# Patient Record
Sex: Male | Born: 1995 | Race: White | Hispanic: No | Marital: Single | Smoking: Never smoker
Health system: Southern US, Community
[De-identification: ages and names within clinical notes are randomized; demographics above are authoritative.]

## PROBLEM LIST (undated history)

## (undated) DIAGNOSIS — G43909 Migraine, unspecified, not intractable, without status migrainosus: Secondary | ICD-10-CM

## (undated) HISTORY — DX: Migraine, unspecified, not intractable, without status migrainosus: G43.909

---
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
## 2015-05-12 ENCOUNTER — Encounter: Payer: Self-pay | Admitting: Family Medicine

## 2015-05-12 ENCOUNTER — Ambulatory Visit (INDEPENDENT_AMBULATORY_CARE_PROVIDER_SITE_OTHER): Payer: PRIVATE HEALTH INSURANCE | Admitting: Family Medicine

## 2015-05-12 VITALS — BP 138/82 | HR 94 | Temp 98.3°F | Ht 71.0 in | Wt 164.0 lb

## 2015-05-12 DIAGNOSIS — J209 Acute bronchitis, unspecified: Secondary | ICD-10-CM | POA: Diagnosis not present

## 2015-05-12 MED ORDER — AZITHROMYCIN 250 MG PO TABS: ORAL_TABLET | ORAL | Status: DC

## 2015-05-12 MED ORDER — HYDROCODONE-HOMATROPINE 5-1.5 MG/5ML PO SYRP: 5.0000 mL | ORAL_SOLUTION | ORAL | Status: DC | PRN

## 2015-05-12 NOTE — Progress Notes (Signed)
Pre visit review using our clinic review tool, if applicable. No additional management support is needed unless otherwise documented below in the visit note. 

## 2015-05-12 NOTE — Progress Notes (Signed)
   Subjective:    Patient ID: Lance Vasquez, male    DOB: 04/10/1996, 19 y.o.   MRN: [id number]  HPI Here for 8 days of chest tightness and coughing up green sputum. He started off with body aches, HA, and ST also but these went away.    Review of Systems  Constitutional: Positive for fever.  HENT: Positive for congestion and postnasal drip. Negative for sinus pressure and sore throat.   Eyes: Negative.   Respiratory: Positive for cough and chest tightness.        Objective:   Physical Exam  Constitutional: He appears well-developed and well-nourished.  HENT:  Right Ear: External ear normal.  Left Ear: External ear normal.  Nose: Nose normal.  Mouth/Throat: Oropharynx is clear and moist.  Eyes: Conjunctivae are normal.  Pulmonary/Chest: Effort normal. No respiratory distress. He has no wheezes. He has no rales.  Scattered rhonchi   Lymphadenopathy:    He has no cervical adenopathy.          Assessment & Plan:  Bronchitis, treat with a Zpack.

## 2015-07-12 ENCOUNTER — Ambulatory Visit (INDEPENDENT_AMBULATORY_CARE_PROVIDER_SITE_OTHER): Payer: PRIVATE HEALTH INSURANCE | Admitting: Family Medicine

## 2015-07-12 ENCOUNTER — Encounter: Payer: Self-pay | Admitting: Family Medicine

## 2015-07-12 VITALS — BP 135/85 | HR 89 | Temp 98.8°F | Ht 71.0 in | Wt 174.0 lb

## 2015-07-12 DIAGNOSIS — N509 Disorder of male genital organs, unspecified: Secondary | ICD-10-CM | POA: Diagnosis not present

## 2015-07-12 DIAGNOSIS — N5089 Other specified disorders of the male genital organs: Secondary | ICD-10-CM

## 2015-07-12 DIAGNOSIS — J309 Allergic rhinitis, unspecified: Secondary | ICD-10-CM | POA: Diagnosis not present

## 2015-07-12 NOTE — Progress Notes (Signed)
   Subjective:    Patient ID: Lance Vasquez, male    DOB: 03/25/1996, 20 y.o.   MRN: [id number]  HPI Here for 2 things. First he has had intermittent nasal congestion in both sides of the nose for the past month. No other symptoms. Also yesterday he felt a small lump in the scrotum that worried him, but today he cannot feel it. There was no discomfort, no urethral DC, and no urinary symptoms. He admits to having more frequent intercourse with his girlfriend this past weekend than usual.    Review of Systems  Constitutional: Negative.   HENT: Positive for congestion. Negative for facial swelling, nosebleeds, postnasal drip, rhinorrhea, sinus pressure and sore throat.   Eyes: Negative.   Respiratory: Negative.   Genitourinary: Negative for dysuria, urgency, frequency, hematuria, discharge, penile swelling, scrotal swelling, genital sores, penile pain and testicular pain.       Objective:   Physical Exam  Constitutional: He appears well-developed and well-nourished.  HENT:  Right Ear: External ear normal.  Left Ear: External ear normal.  Nose: Nose normal.  Mouth/Throat: Oropharynx is clear and moist. No oropharyngeal exudate.  Eyes: Conjunctivae are normal.  Genitourinary:  Normal scrotal exam, no masses or tenderness  Lymphadenopathy:    He has no cervical adenopathy.          Assessment & Plan:  Nasal congestion, try Flonase daily. His scrotal exam is normal. I suspect he may have felt one of his vas deferens structures over the weekend. He was reassured, and he wlil follow up prn

## 2015-07-12 NOTE — Progress Notes (Signed)
Pre visit review using our clinic review tool, if applicable. No additional management support is needed unless otherwise documented below in the visit note. 

## 2022-01-24 ENCOUNTER — Encounter: Payer: Self-pay | Admitting: Family Medicine

## 2022-01-24 ENCOUNTER — Ambulatory Visit (INDEPENDENT_AMBULATORY_CARE_PROVIDER_SITE_OTHER): Payer: BC Managed Care – PPO | Admitting: Family Medicine

## 2022-01-24 VITALS — BP 128/78 | HR 104 | Temp 98.9°F | Wt 197.0 lb

## 2022-01-24 DIAGNOSIS — J019 Acute sinusitis, unspecified: Secondary | ICD-10-CM

## 2022-01-24 DIAGNOSIS — R059 Cough, unspecified: Secondary | ICD-10-CM

## 2022-01-24 LAB — POC COVID19 BINAXNOW: SARS Coronavirus 2 Ag: NEGATIVE

## 2022-01-24 LAB — POCT INFLUENZA A/B
Influenza A, POC: NEGATIVE
Influenza B, POC: NEGATIVE

## 2022-01-24 MED ORDER — HYDROCODONE BIT-HOMATROP MBR 5-1.5 MG/5ML PO SOLN: 5.0000 mL | ORAL | 0 refills | Status: DC | PRN

## 2022-01-24 MED ORDER — AZITHROMYCIN 250 MG PO TABS: ORAL_TABLET | ORAL | 0 refills | Status: DC

## 2022-01-24 NOTE — Progress Notes (Signed)
   Subjective:    Patient ID: Lance Vasquez, male    DOB: 26-Dec-1995, 26 y.o.   MRN: [id number]  HPI Here for 5 days of sinus congestion, PND, ST, and a dry cough. No fever or SOB. Using Mucinex.    Review of Systems  Constitutional: Negative.   HENT:  Positive for congestion, postnasal drip, sinus pressure and sore throat. Negative for ear pain.   Eyes: Negative.   Respiratory:  Positive for cough. Negative for shortness of breath and wheezing.        Objective:   Physical Exam Constitutional:      Appearance: Normal appearance. He is not ill-appearing.  HENT:     Right Ear: Tympanic membrane, ear canal and external ear normal.     Left Ear: Tympanic membrane, ear canal and external ear normal.     Nose: Nose normal.     Mouth/Throat:     Pharynx: Oropharynx is clear.  Eyes:     Conjunctiva/sclera: Conjunctivae normal.  Pulmonary:     Effort: Pulmonary effort is normal.     Breath sounds: Normal breath sounds.  Lymphadenopathy:     Cervical: No cervical adenopathy.  Neurological:     Mental Status: He is alert.           Assessment & Plan:  Sinusitis, treat with a Zpack.  Gershon Crane, MD

## 2022-07-31 ENCOUNTER — Ambulatory Visit: Payer: BC Managed Care – PPO | Admitting: Family Medicine

## 2022-07-31 ENCOUNTER — Encounter: Payer: Self-pay | Admitting: Family Medicine

## 2022-07-31 VITALS — BP 120/82 | HR 84 | Temp 98.6°F | Wt 205.0 lb

## 2022-07-31 DIAGNOSIS — N451 Epididymitis: Secondary | ICD-10-CM | POA: Diagnosis not present

## 2022-07-31 MED ORDER — DOXYCYCLINE HYCLATE 100 MG PO CAPS: 100.0000 mg | ORAL_CAPSULE | Freq: Two times a day (BID) | ORAL | 0 refills | Status: AC

## 2022-07-31 NOTE — Progress Notes (Signed)
   Subjective:    Patient ID: Lance Vasquez, male    DOB: June 05, 1996, 27 y.o.   MRN: [id number]  HPI Here for 2 weeks of intermittent pain in the left testicle. It is usually worst at the top or the back of the testicle. No hx of trauma. No swelling. He has had 3 episodes of this in the past 3 years. He has always been given a course of Cipro, and this seems to take care of it. No urinary burning or urgency. No urethral DC.    Review of Systems  Constitutional: Negative.   Respiratory: Negative.    Cardiovascular: Negative.   Genitourinary:  Positive for testicular pain. Negative for dysuria, genital sores, penile discharge, penile pain, scrotal swelling and urgency.       Objective:   Physical Exam Constitutional:      Appearance: Normal appearance.  Cardiovascular:     Rate and Rhythm: Normal rate and regular rhythm.     Pulses: Normal pulses.     Heart sounds: Normal heart sounds.  Pulmonary:     Effort: Pulmonary effort is normal.     Breath sounds: Normal breath sounds.  Genitourinary:    Comments: No inguinal hernias. The scrotum and testicles are normal on exam. No tenderness  Neurological:     Mental Status: He is alert.           Assessment & Plan:  Epididymitis. This time we will treat him with 10 days of Doxycycline. Recheck as needed.  Alysia Penna, MD

## 2022-08-28 ENCOUNTER — Encounter: Payer: Self-pay | Admitting: Family Medicine

## 2022-08-28 ENCOUNTER — Ambulatory Visit: Payer: BC Managed Care – PPO | Admitting: Family Medicine

## 2022-08-28 VITALS — BP 128/82 | HR 91 | Temp 98.6°F | Ht 71.0 in | Wt 210.0 lb

## 2022-08-28 DIAGNOSIS — R3915 Urgency of urination: Secondary | ICD-10-CM | POA: Diagnosis not present

## 2022-08-28 DIAGNOSIS — R350 Frequency of micturition: Secondary | ICD-10-CM | POA: Diagnosis not present

## 2022-08-28 LAB — POC URINALSYSI DIPSTICK (AUTOMATED)
Bilirubin, UA: NEGATIVE
Blood, UA: NEGATIVE
Glucose, UA: NEGATIVE
Ketones, UA: NEGATIVE
Leukocytes, UA: NEGATIVE
Nitrite, UA: NEGATIVE
Protein, UA: NEGATIVE
Spec Grav, UA: 1.02 (ref 1.010–1.025)
Urobilinogen, UA: 0.2 E.U./dL
pH, UA: 6 (ref 5.0–8.0)

## 2022-08-28 MED ORDER — DOXYCYCLINE HYCLATE 100 MG PO CAPS: 100.0000 mg | ORAL_CAPSULE | Freq: Two times a day (BID) | ORAL | 0 refills | Status: DC

## 2022-08-28 NOTE — Progress Notes (Signed)
   Subjective:    Patient ID: Gwenyth Bouillon, male    DOB: Aug 18, 1995, 27 y.o.   MRN: [id number]  HPI Here for one week of urinary urgency and for post-urination dribbling. No back pain or fever. No testicular pain. We saw him last month for pain in the left testicle, and we prescribed a 10 day course of Doxycycline. He says the pain went away and he never picked up the Doxycycline. Now he has symptoms again.    Review of Systems  Constitutional: Negative.   Respiratory: Negative.    Cardiovascular: Negative.   Gastrointestinal: Negative.   Genitourinary:  Positive for frequency and urgency. Negative for difficulty urinating, dysuria, flank pain, hematuria, penile discharge and testicular pain.       Objective:   Physical Exam Constitutional:      Appearance: Normal appearance.  Cardiovascular:     Rate and Rhythm: Normal rate and regular rhythm.     Pulses: Normal pulses.     Heart sounds: Normal heart sounds.  Pulmonary:     Effort: Pulmonary effort is normal.     Breath sounds: Normal breath sounds.  Neurological:     Mental Status: He is alert.           Assessment & Plan:  Urinary urgency, likely due to a prostate infection. I advised him to have the pharmacy prepare the Doxycycline again and to take the full 10 day course. We will send the sample for a culture and for GC/Chlamydia testing.  Alysia Penna, MD

## 2022-08-29 LAB — URINE CULTURE
MICRO NUMBER:: 14613758
Result:: NO GROWTH
SPECIMEN QUALITY:: ADEQUATE

## 2022-08-29 LAB — C. TRACHOMATIS/N. GONORRHOEAE RNA
C. trachomatis RNA, TMA: NOT DETECTED
N. gonorrhoeae RNA, TMA: NOT DETECTED

## 2022-12-06 ENCOUNTER — Ambulatory Visit: Payer: BC Managed Care – PPO | Admitting: Family Medicine

## 2022-12-06 ENCOUNTER — Encounter: Payer: Self-pay | Admitting: Family Medicine

## 2022-12-06 VITALS — BP 118/78 | HR 102 | Temp 98.2°F | Wt 201.0 lb

## 2022-12-06 DIAGNOSIS — M25512 Pain in left shoulder: Secondary | ICD-10-CM

## 2022-12-06 DIAGNOSIS — G8929 Other chronic pain: Secondary | ICD-10-CM

## 2022-12-06 DIAGNOSIS — R29898 Other symptoms and signs involving the musculoskeletal system: Secondary | ICD-10-CM

## 2022-12-06 NOTE — Progress Notes (Signed)
   Subjective:    Patient ID: Lance Vasquez, male    DOB: 10-29-1995, 27 y.o.   MRN: [id number]  HPI Here for intermittent pain in the left shoulder that started 3 years ago. There is no specific hx of trauma, but he lifts weights frequently, and the pain flares up after he has lifted a few times. The pain then goes away with rest. He has never treated this with anything. The pain is sharp and in the anterior shoulder. He also asks about some mild weakness in the right arm that he first noticed 3 days ago. There is no particular pain. No neck pains. Today this feels fine.    Review of Systems  Constitutional: Negative.   Respiratory: Negative.    Cardiovascular: Negative.   Musculoskeletal:  Positive for arthralgias.  Neurological:  Positive for weakness. Negative for numbness.       Objective:   Physical Exam Constitutional:      General: He is not in acute distress.    Appearance: Normal appearance.  Cardiovascular:     Rate and Rhythm: Normal rate and regular rhythm.     Pulses: Normal pulses.     Heart sounds: Normal heart sounds.  Pulmonary:     Effort: Pulmonary effort is normal.     Breath sounds: Normal breath sounds.  Musculoskeletal:     Comments: Both shoulders are normal with no tenderness and no crepitus. ROM is full on both sides. Strength is intact on both sides.   Neurological:     Mental Status: He is alert.           Assessment & Plan:  He has a chronic left shoulder pain, and we will refer him to Sports Medicine for this. The etiology of the right arm weakness is not clear, so we will continue to monitor this.  Gershon Crane, MD

## 2022-12-08 NOTE — Progress Notes (Unsigned)
    Lance Vasquez D.[facility] [address] [place] [address] Phone: [phone]   Assessment and Plan:     There are no diagnoses linked to this encounter.  ***   Pertinent previous records reviewed include ***   Follow Up: ***     Subjective:   I, Lance Vasquez, am serving as a Neurosurgeon for Lance Vasquez  Chief Complaint: left shoulder pain   HPI:   12/11/2022  Patient is a 27 year old male complaining of left shoulder pain. Patient states  Relevant Historical Information: ***  Additional pertinent review of systems negative.   Current Outpatient Medications:    acetaminophen (TYLENOL) 325 MG tablet, Take 2 tablets (650 mg total) by mouth every 6 (six) hours as needed for pain., Disp: , Rfl:    cetirizine (ZYRTEC) 10 MG tablet, Take 10 mg by mouth daily as needed for allergies., Disp: , Rfl:    ibuprofen (ADVIL) 200 MG tablet, Take 2-3 tablets every 6 hours., Disp: 30 tablet, Rfl: 0   Objective:     There were no vitals filed for this visit.    There is no height or weight on file to calculate BMI.    Physical Exam:    ***   Electronically signed by:  Lance Vasquez D.[facility] 2:55 PM 12/08/22

## 2022-12-11 ENCOUNTER — Ambulatory Visit: Payer: BC Managed Care – PPO | Admitting: Sports Medicine

## 2022-12-11 ENCOUNTER — Ambulatory Visit (INDEPENDENT_AMBULATORY_CARE_PROVIDER_SITE_OTHER): Payer: BC Managed Care – PPO

## 2022-12-11 VITALS — BP 118/68 | HR 101 | Ht 71.0 in | Wt 201.0 lb

## 2022-12-11 DIAGNOSIS — M25512 Pain in left shoulder: Secondary | ICD-10-CM

## 2022-12-11 DIAGNOSIS — G8929 Other chronic pain: Secondary | ICD-10-CM

## 2022-12-11 MED ORDER — MELOXICAM 15 MG PO TABS: 15.0000 mg | ORAL_TABLET | Freq: Every day | ORAL | 0 refills | Status: DC

## 2022-12-11 NOTE — Patient Instructions (Signed)
-   Start meloxicam 15 mg daily x2 weeks.  If still having pain after 2 weeks, complete 3rd-week of meloxicam. May use remaining meloxicam as needed once daily for pain control.  Do not to use additional NSAIDs while taking meloxicam.  May use Tylenol 500-1000 mg 2 to 3 times a day for breakthrough pain. Shoulder HEP  4 week follow up  

## 2023-01-08 ENCOUNTER — Ambulatory Visit: Payer: BC Managed Care – PPO | Admitting: Sports Medicine

## 2023-01-19 NOTE — Progress Notes (Deleted)
    [name] D.[name] [address] [place] [address] Phone: [phone]   Assessment and Plan:     There are no diagnoses linked to this encounter.  ***   Pertinent previous records reviewed include ***   Follow Up: ***     Subjective:   I,  , am serving as a Neurosurgeon for Doctor Richardean Sale   Chief Complaint: left shoulder pain    HPI:    12/11/2022  Patient is a 27 year old male complaining of left shoulder pain. Patient states he has had pain since the beginning of the year , he feels it when he does any kind of push exercises/ or chest exercises. He gets intermittent snagging and popping. Pain radiates into the neck when he does have it. No meds, no numbness or tingling, he notes weakness or he doesn't have his full strength, states he had a weird hand tremor last week when he was eating dinner that subsided after 10 min   He states he feels some carpal tunnel going on in his right arm that started last week   01/22/2023 Patient states    Relevant Historical Information: None pertinent  Additional pertinent review of systems negative.   Current Outpatient Medications:    acetaminophen (TYLENOL) 325 MG tablet, Take 2 tablets (650 mg total) by mouth every 6 (six) hours as needed for pain., Disp: , Rfl:    cetirizine (ZYRTEC) 10 MG tablet, Take 10 mg by mouth daily as needed for allergies., Disp: , Rfl:    ibuprofen (ADVIL) 200 MG tablet, Take 2-3 tablets every 6 hours., Disp: 30 tablet, Rfl: 0   meloxicam (MOBIC) 15 MG tablet, Take 1 tablet (15 mg total) by mouth daily., Disp: 30 tablet, Rfl: 0   Objective:     There were no vitals filed for this visit.    There is no height or weight on file to calculate BMI.    Physical Exam:    ***   Electronically signed by:  [name] D.[name] 7:24 AM 01/19/23

## 2023-01-22 ENCOUNTER — Ambulatory Visit: Payer: BC Managed Care – PPO | Admitting: Sports Medicine

## 2023-02-20 ENCOUNTER — Encounter: Payer: Self-pay | Admitting: Family Medicine

## 2023-02-20 ENCOUNTER — Ambulatory Visit: Payer: BC Managed Care – PPO | Admitting: Family Medicine

## 2023-02-20 VITALS — BP 118/80 | HR 96 | Temp 98.7°F | Wt 202.0 lb

## 2023-02-20 DIAGNOSIS — K529 Noninfective gastroenteritis and colitis, unspecified: Secondary | ICD-10-CM | POA: Diagnosis not present

## 2023-02-20 MED ORDER — CIPROFLOXACIN HCL 500 MG PO TABS: 500.0000 mg | ORAL_TABLET | Freq: Two times a day (BID) | ORAL | 0 refills | Status: AC

## 2023-02-20 NOTE — Progress Notes (Signed)
   Subjective:    Patient ID: Lance Vasquez, male    DOB: 29-Mar-1996, 27 y.o.   MRN: [id number]  HPI Here for 3 weeks of diffuse abdominal' cramps, nausea without vomiting, and diarrhea. No fever. No recent travelling. Using Pepto-Bismol.    Review of Systems  Constitutional: Negative.   Respiratory: Negative.    Cardiovascular: Negative.   Gastrointestinal:  Positive for abdominal pain, diarrhea and nausea. Negative for abdominal distention, anal bleeding, blood in stool, constipation and vomiting.  Genitourinary: Negative.        Objective:   Physical Exam Constitutional:      Appearance: Normal appearance.  Cardiovascular:     Rate and Rhythm: Normal rate and regular rhythm.     Pulses: Normal pulses.     Heart sounds: Normal heart sounds.  Pulmonary:     Effort: Pulmonary effort is normal.     Breath sounds: Normal breath sounds.  Abdominal:     General: Abdomen is flat. Bowel sounds are normal. There is no distension.     Palpations: Abdomen is soft. There is no mass.     Tenderness: There is no abdominal tenderness. There is no right CVA tenderness, left CVA tenderness, guarding or rebound.     Hernia: No hernia is present.  Neurological:     Mental Status: He is alert.           Assessment & Plan:  Enteritis. Treat with 10 days of Cipro.  Gershon Crane, MD

## 2023-06-29 ENCOUNTER — Ambulatory Visit: Payer: BC Managed Care – PPO | Admitting: Family Medicine

## 2023-06-29 ENCOUNTER — Encounter: Payer: Self-pay | Admitting: Family Medicine

## 2023-06-29 VITALS — BP 110/72 | HR 78 | Temp 98.7°F | Wt 205.0 lb

## 2023-06-29 DIAGNOSIS — R197 Diarrhea, unspecified: Secondary | ICD-10-CM

## 2023-06-29 NOTE — Progress Notes (Signed)
   Subjective:    Patient ID: Lance Vasquez, male    DOB: 01-29-1996, 27 y.o.   MRN: [id number]  HPI Here asking for a referral to GI for various symptoms that come and go. These started about 6 months ago, and they seem to be more frequent now. He describes spells of generalized abdominal cramps with diarrhea. He cannot find a pattern between these episodes and his diet. Sometimes he gets heartburn, and he treats this with Pepcid as needed. Today he feels fine.    Review of Systems  Constitutional: Negative.   Respiratory: Negative.    Cardiovascular: Negative.   Gastrointestinal:  Positive for abdominal pain and diarrhea. Negative for abdominal distention, blood in stool, constipation, nausea and vomiting.       Objective:   Physical Exam Constitutional:      Appearance: Normal appearance.  Cardiovascular:     Rate and Rhythm: Normal rate and regular rhythm.     Pulses: Normal pulses.     Heart sounds: Normal heart sounds.  Pulmonary:     Effort: Pulmonary effort is normal.     Breath sounds: Normal breath sounds.  Abdominal:     General: Abdomen is flat. Bowel sounds are normal. There is no distension.     Palpations: Abdomen is soft. There is no mass.     Tenderness: There is no abdominal tenderness. There is no guarding or rebound.     Hernia: No hernia is present.  Neurological:     Mental Status: He is alert.           Assessment & Plan:  Intermittent diarrhea consistent with IBS. We discussed limiting his intake of spicy or fatty foods, caffeine and alcohol. Refer to GI.  Gershon Crane, MD

## 2023-07-10 ENCOUNTER — Encounter: Payer: Self-pay | Admitting: Family Medicine

## 2023-07-10 ENCOUNTER — Encounter: Payer: Self-pay | Admitting: Gastroenterology

## 2023-07-10 ENCOUNTER — Ambulatory Visit: Payer: BC Managed Care – PPO | Admitting: Family Medicine

## 2023-07-10 VITALS — BP 110/78 | HR 89 | Temp 98.6°F | Wt 201.0 lb

## 2023-07-10 DIAGNOSIS — J069 Acute upper respiratory infection, unspecified: Secondary | ICD-10-CM

## 2023-07-10 DIAGNOSIS — R0989 Other specified symptoms and signs involving the circulatory and respiratory systems: Secondary | ICD-10-CM | POA: Diagnosis not present

## 2023-07-10 LAB — POCT INFLUENZA A/B
Influenza A, POC: NEGATIVE
Influenza B, POC: NEGATIVE

## 2023-07-10 LAB — POC COVID19 BINAXNOW: SARS Coronavirus 2 Ag: NEGATIVE

## 2023-07-10 NOTE — Progress Notes (Signed)
   Subjective:    Patient ID: Massie Harden, male    DOB: 13-Nov-1995, 28 y.o.   MRN: [id number]  HPI Here for one week of fatigue and a dry cough. This started with a ST and some fever, but these went away. No SOB. Today he feels a little better. His wife had a similar infection 2 weeks ago.    Review of Systems  Constitutional: Negative.   HENT:  Positive for congestion. Negative for ear pain, postnasal drip, sinus pain and sore throat.   Eyes: Negative.   Respiratory:  Positive for cough. Negative for shortness of breath and wheezing.        Objective:   Physical Exam Constitutional:      Appearance: Normal appearance.  HENT:     Right Ear: Tympanic membrane, ear canal and external ear normal.     Left Ear: Tympanic membrane, ear canal and external ear normal.     Nose: Nose normal.     Mouth/Throat:     Pharynx: Oropharynx is clear.  Eyes:     Conjunctiva/sclera: Conjunctivae normal.  Pulmonary:     Effort: Pulmonary effort is normal.     Breath sounds: Normal breath sounds.  Lymphadenopathy:     Cervical: No cervical adenopathy.  Neurological:     Mental Status: He is alert.           Assessment & Plan:  Viral URI. This should resolve in the next few days. Drink fluids. Recheck as needed.  Garnette Olmsted, MD

## 2023-07-24 ENCOUNTER — Encounter: Payer: BC Managed Care – PPO | Admitting: Family Medicine

## 2023-08-06 ENCOUNTER — Encounter: Payer: Self-pay | Admitting: Gastroenterology

## 2023-08-06 ENCOUNTER — Other Ambulatory Visit (INDEPENDENT_AMBULATORY_CARE_PROVIDER_SITE_OTHER): Payer: BC Managed Care – PPO

## 2023-08-06 ENCOUNTER — Ambulatory Visit: Payer: BC Managed Care – PPO | Admitting: Gastroenterology

## 2023-08-06 VITALS — BP 112/86 | HR 84 | Ht 70.0 in | Wt 202.4 lb

## 2023-08-06 DIAGNOSIS — Z8619 Personal history of other infectious and parasitic diseases: Secondary | ICD-10-CM

## 2023-08-06 DIAGNOSIS — R197 Diarrhea, unspecified: Secondary | ICD-10-CM

## 2023-08-06 DIAGNOSIS — R110 Nausea: Secondary | ICD-10-CM | POA: Diagnosis not present

## 2023-08-06 DIAGNOSIS — R195 Other fecal abnormalities: Secondary | ICD-10-CM | POA: Diagnosis not present

## 2023-08-06 DIAGNOSIS — K219 Gastro-esophageal reflux disease without esophagitis: Secondary | ICD-10-CM | POA: Diagnosis not present

## 2023-08-06 DIAGNOSIS — R1030 Lower abdominal pain, unspecified: Secondary | ICD-10-CM | POA: Diagnosis not present

## 2023-08-06 LAB — CBC WITH DIFFERENTIAL/PLATELET
Basophils Absolute: 0 10*3/uL (ref 0.0–0.1)
Basophils Relative: 0.8 % (ref 0.0–3.0)
Eosinophils Absolute: 0.2 10*3/uL (ref 0.0–0.7)
Eosinophils Relative: 3.4 % (ref 0.0–5.0)
HCT: 42.6 % (ref 39.0–52.0)
Hemoglobin: 14.6 g/dL (ref 13.0–17.0)
Lymphocytes Relative: 37.8 % (ref 12.0–46.0)
Lymphs Abs: 2.2 10*3/uL (ref 0.7–4.0)
MCHC: 34.2 g/dL (ref 30.0–36.0)
MCV: 90.8 fL (ref 78.0–100.0)
Monocytes Absolute: 0.4 10*3/uL (ref 0.1–1.0)
Monocytes Relative: 7.1 % (ref 3.0–12.0)
Neutro Abs: 2.9 10*3/uL (ref 1.4–7.7)
Neutrophils Relative %: 50.9 % (ref 43.0–77.0)
Platelets: 245 10*3/uL (ref 150.0–400.0)
RBC: 4.69 Mil/uL (ref 4.22–5.81)
RDW: 13.1 % (ref 11.5–15.5)
WBC: 5.8 10*3/uL (ref 4.0–10.5)

## 2023-08-06 LAB — COMPREHENSIVE METABOLIC PANEL
ALT: 13 U/L (ref 0–53)
AST: 14 U/L (ref 0–37)
Albumin: 4.5 g/dL (ref 3.5–5.2)
Alkaline Phosphatase: 34 U/L — ABNORMAL LOW (ref 39–117)
BUN: 12 mg/dL (ref 6–23)
CO2: 27 meq/L (ref 19–32)
Calcium: 9.3 mg/dL (ref 8.4–10.5)
Chloride: 105 meq/L (ref 96–112)
Creatinine, Ser: 1.08 mg/dL (ref 0.40–1.50)
GFR: 93.83 mL/min (ref >60.00)
Glucose, Bld: 98 mg/dL (ref 70–99)
Potassium: 4.3 meq/L (ref 3.5–5.1)
Sodium: 139 meq/L (ref 135–145)
Total Bilirubin: 0.6 mg/dL (ref 0.2–1.2)
Total Protein: 8.1 g/dL (ref 6.0–8.3)

## 2023-08-06 MED ORDER — PANTOPRAZOLE SODIUM 40 MG PO TBEC: 40.0000 mg | DELAYED_RELEASE_TABLET | Freq: Every day | ORAL | 3 refills | Status: DC

## 2023-08-06 NOTE — Progress Notes (Signed)
 Agree with assessment / plan as outlined.

## 2023-08-06 NOTE — Patient Instructions (Addendum)
You have been scheduled for an endoscopy. Please follow written instructions given to you at your visit today.  If you use inhalers (even only as needed), please bring them with you on the day of your procedure.  If you take any of the following medications, they will need to be adjusted prior to your procedure:   DO NOT TAKE 7 DAYS PRIOR TO TEST- Trulicity (dulaglutide) Ozempic, Wegovy (semaglutide) Mounjaro (tirzepatide) Bydureon Bcise (exanatide extended release)  DO NOT TAKE 1 DAY PRIOR TO YOUR TEST Rybelsus (semaglutide) Adlyxin (lixisenatide) Victoza (liraglutide) Byetta (exanatide) ___________________________________________________________________________  Your provider has requested that you go to the basement level for lab work before leaving today. Press "B" on the elevator. The lab is located at the first door on the left as you exit the elevator.      Due to recent changes in healthcare laws, you may see the results of your imaging and laboratory studies on MyChart before your provider has had a chance to review them.  We understand that in some cases there may be results that are confusing or concerning to you. Not all laboratory results come back in the same time frame and the provider may be waiting for multiple results in order to interpret others.  Please give [place] 48 hours in order for your provider to thoroughly review all the results before contacting the office for clarification of your results.    Follow up after recommendations after EGD  Fiber Daily  I appreciate the  opportunity to care for you  Thank You   [facility]

## 2023-08-06 NOTE — Progress Notes (Signed)
Chief Complaint: GERD, abdominal pain, diarrhea Primary GI MD: Unassigned  HPI: 28 year old male presents for evaluation of multiple GI issues.  Previously seen at [facility] in [place]. Unable to view records. Appears he underwent  03/2020 hydrogen breath test.  Discussed the use of AI scribe software for clinical note transcription with the patient, who gave verbal consent to proceed.  History of Present Illness   The patient presents with chronic gastrointestinal issues, including stomach sensitivity and irregular bowel movements.  Chronic gastrointestinal issues have persisted since the summer of 2018, initially presenting with severe cramping that necessitated hospitalization. Initial diagnostic tests, including a CT scan, were unremarkable, and symptoms were attributed to a viral infection. Since then, the patient has experienced increased stomach sensitivity with intermittent episodes impacting daily activities.  Current symptoms are primarily localized to the lower abdomen, with no specific food triggers identified. However, caffeine intake has been reduced, and alcohol exacerbates symptoms. Irregular bowel movements, predominantly loose stools, have been present since early December following an episode of severe stomach upset. Nausea is associated with significant heartburn and chest burn, attributed to stomach acid buildup.  In January, a course of Nexium provided temporary relief for both upper and lower gastrointestinal symptoms. A hydrogen breath test was conducted in 2021, followed by antibiotic treatment, though the results and impact are not recalled. There is occasional use of Excedrin as needed, but not daily.  No family history of inflammatory bowel disease.     Past Medical History:  Diagnosis Date   Migraines     Past Surgical History:  Procedure Laterality Date   EYE EXAMINATION UNDER ANESTHESIA Bilateral 01/15/2013   Procedure: Repair of Eyelid Lacerations;   Surgeon: Corinda Gubler, MD;  Location: [facility] OR;  Service: Ophthalmology;  Laterality: Bilateral;   FEMUR IM NAIL Right 01/15/2013   Procedure: INTRAMEDULLARY (IM) NAIL FEMORAL;  Surgeon: Mable Paris, MD;  Location: [facility] OR;  Service: Orthopedics;  Laterality: Right;   FOREIGN BODY REMOVAL Bilateral 01/15/2013   Procedure: FOREIGN BODY REMOVAL;  Surgeon: Corinda Gubler, MD;  Location: [facility] OR;  Service: Ophthalmology;  Laterality: Bilateral;   INCISION AND DRAINAGE OF WOUND Bilateral 01/15/2013   Procedure: IRRIGATION AND DEBRIDEMENT WOUND;  Surgeon: Corinda Gubler, MD;  Location: [facility] OR;  Service: Ophthalmology;  Laterality: Bilateral;   LACERATION REPAIR N/A 01/15/2013   Procedure: LACERATION REPAIR;  Surgeon: Mable Paris, MD;  Location: [facility] OR;  Service: Orthopedics;  Laterality: N/A;   TIBIA IM NAIL INSERTION Right 01/15/2013   Procedure: INTRAMEDULLARY (IM) NAIL TIBIAL;  Surgeon: Mable Paris, MD;  Location: [facility] OR;  Service: Orthopedics;  Laterality: Right;    Current Outpatient Medications  Medication Sig Dispense Refill   Aspirin-Acetaminophen-Caffeine (EXCEDRIN EXTRA STRENGTH PO) Take 1 capsule by mouth as needed.     cetirizine (ZYRTEC) 10 MG tablet Take 10 mg by mouth daily as needed for allergies.     pantoprazole (PROTONIX) 40 MG tablet Take 1 tablet (40 mg total) by mouth daily. 30 tablet 3   No current facility-administered medications for this visit.    Allergies as of 08/06/2023   (No Known Allergies)    Family History  Problem Relation Age of Onset   Hypertension Maternal Grandmother     Social History   Socioeconomic History   Marital status: Single    Spouse name: Not on file   Number of children: 0   Years of education: Not on file   Highest education  level: Not on file  Occupational History   Occupation: Charity fundraiser  Tobacco Use   Smoking status: Never   Smokeless tobacco: Never  Vaping Use   Vaping status: Never Used   Substance and Sexual Activity   Alcohol use: Yes    Comment: <1 per day   Drug use: No   Sexual activity: Not on file  Other Topics Concern   Not on file  Social History Narrative   Not on file   Social Drivers of Health   Financial Resource Strain: Not on file  Food Insecurity: Not on file  Transportation Needs: Not on file  Physical Activity: Not on file  Stress: Not on file  Social Connections: Not on file  Intimate Partner Violence: Not on file    Review of Systems:    Constitutional: No weight loss, fever, chills, weakness or fatigue HEENT: Eyes: No change in vision               Ears, Nose, Throat:  No change in hearing or congestion Skin: No rash or itching Cardiovascular: No chest pain, chest pressure or palpitations   Respiratory: No SOB or cough Gastrointestinal: See HPI and otherwise negative Genitourinary: No dysuria or change in urinary frequency Neurological: No headache, dizziness or syncope Musculoskeletal: No new muscle or joint pain Hematologic: No bleeding or bruising Psychiatric: No history of depression or anxiety    Physical Exam:  Vital signs: BP (!) 134/90 (BP Location: Left Arm, Patient Position: Sitting, Cuff Size: Normal)   Pulse 84   Ht 5\' 10"  (1.778 m) Comment: height measured without shoes  Wt 202 lb 6 oz (91.8 kg)   BMI 29.04 kg/m   Constitutional: NAD, Well developed, Well nourished, alert and cooperative Head:  Normocephalic and atraumatic. Eyes:   PEERL, EOMI. No icterus. Conjunctiva pink. Respiratory: Respirations even and unlabored. Lungs clear to auscultation bilaterally.   No wheezes, crackles, or rhonchi.  Cardiovascular:  Regular rate and rhythm. No peripheral edema, cyanosis or pallor.  Gastrointestinal:  Soft, nondistended, nontender. No rebound or guarding. Normal bowel sounds. No appreciable masses or hepatomegaly. Rectal:  Not performed.  Msk:  Symmetrical without gross deformities. Without edema, no deformity or joint  abnormality.  Neurologic:  Alert and  oriented x4;  grossly normal neurologically.  Skin:   Dry and intact without significant lesions or rashes. Psychiatric: Oriented to person, place and time. Demonstrates good judgement and reason without abnormal affect or behaviors.   RELEVANT LABS AND IMAGING: CBC    Component Value Date/Time   WBC 8.4 01/18/2013 1300   RBC 3.20 (L) 01/18/2013 1300   HGB 10.3 (L) 01/18/2013 1300   HCT 29.0 (L) 01/18/2013 1300   PLT 162 01/18/2013 1300   MCV 90.6 01/18/2013 1300   MCH 32.2 01/18/2013 1300   MCHC 35.5 01/18/2013 1300   RDW 12.6 01/18/2013 1300    CMP     Component Value Date/Time   NA 137 01/17/2013 0505   K 3.8 01/17/2013 0505   CL 104 01/17/2013 0505   CO2 26 01/17/2013 0505   GLUCOSE 118 (H) 01/17/2013 0505   BUN 8 01/17/2013 0505   CREATININE 0.98 01/17/2013 0505   CALCIUM 8.5 01/17/2013 0505   PROT 6.7 01/15/2013 0215   ALBUMIN 3.6 01/15/2013 0215   AST 37 01/15/2013 0215   ALT 29 01/15/2013 0215   ALKPHOS 63 01/15/2013 0215   BILITOT 0.5 01/15/2013 0215   GFRNONAA NOT CALCULATED 01/17/2013 0505   GFRAA NOT CALCULATED 01/17/2013  0505     Assessment/Plan:   GERD Nausea Lower abdominal pain Questionable history of H. pylori Longstanding history of chronic GERD, nausea, and lower abdominal pain with multiple foods but no identifiable trigger.  No previous EGD.  Questionable history of H. pylori as he reports taking antibiotics after breath test in 2021.  The symptoms improved on a short-term of over-the-counter Nexium.  DDx includes esophagitis, gastritis, PUD.  Rare NSAID use. - EGD for further evaluation.  Will obtain biopsies for celiac disease and H. pylori as well as rule out Barrett's esophagus - I thoroughly discussed the procedure with the patient (at bedside) to include nature of the procedure, alternatives, benefits, and risks (including but not limited to bleeding, infection, perforation, anesthesia/cardiac  pulmonary complications).  Patient verbalized understanding and gave verbal consent to proceed with procedure. - Pantoprazole 40 Mg once daily (to be started after endoscopy to make sure biopsies are accurate) - Educated patient on lifestyle modifications and provided patient education handouts - Also provided low FODMAP handout - Obtain previous records - CBC/CMP - Follow-up per EGD  Loose stools Intermittent loose stools which possibly improved when on Nexium.  Could be GERD related or IBS.  Low suspicion for IBD.  Offered colonoscopy but patient would prefer to hold off with possible. - Fiber supplement daily - Fecal calprotectin - Rule out celiac during EGD  Lara Mulch [facility] 08/06/2023, 9:06 AM  Cc: Nelwyn Salisbury, MD

## 2023-08-22 ENCOUNTER — Encounter: Payer: BC Managed Care – PPO | Admitting: Gastroenterology

## 2023-08-27 ENCOUNTER — Ambulatory Visit (INDEPENDENT_AMBULATORY_CARE_PROVIDER_SITE_OTHER): Payer: BC Managed Care – PPO | Admitting: Family Medicine

## 2023-08-27 ENCOUNTER — Encounter: Payer: Self-pay | Admitting: Family Medicine

## 2023-08-27 VITALS — BP 118/78 | HR 87 | Temp 98.2°F | Ht 70.0 in | Wt 203.0 lb

## 2023-08-27 DIAGNOSIS — Z0000 Encounter for general adult medical examination without abnormal findings: Secondary | ICD-10-CM

## 2023-08-27 DIAGNOSIS — Z23 Encounter for immunization: Secondary | ICD-10-CM

## 2023-08-27 LAB — CBC WITH DIFFERENTIAL/PLATELET
Basophils Absolute: 0.1 10*3/uL (ref 0.0–0.1)
Basophils Relative: 0.9 % (ref 0.0–3.0)
Eosinophils Absolute: 0.1 10*3/uL (ref 0.0–0.7)
Eosinophils Relative: 2 % (ref 0.0–5.0)
HCT: 42.6 % (ref 39.0–52.0)
Hemoglobin: 14.6 g/dL (ref 13.0–17.0)
Lymphocytes Relative: 37.4 % (ref 12.0–46.0)
Lymphs Abs: 2.4 10*3/uL (ref 0.7–4.0)
MCHC: 34.3 g/dL (ref 30.0–36.0)
MCV: 90.6 fl (ref 78.0–100.0)
Monocytes Absolute: 0.4 10*3/uL (ref 0.1–1.0)
Monocytes Relative: 6.3 % (ref 3.0–12.0)
Neutro Abs: 3.4 10*3/uL (ref 1.4–7.7)
Neutrophils Relative %: 53.4 % (ref 43.0–77.0)
Platelets: 248 10*3/uL (ref 150.0–400.0)
RBC: 4.71 Mil/uL (ref 4.22–5.81)
RDW: 13.3 % (ref 11.5–15.5)
WBC: 6.4 10*3/uL (ref 4.0–10.5)

## 2023-08-27 LAB — BASIC METABOLIC PANEL
BUN: 16 mg/dL (ref 6–23)
CO2: 26 meq/L (ref 19–32)
Calcium: 9.4 mg/dL (ref 8.4–10.5)
Chloride: 104 meq/L (ref 96–112)
Creatinine, Ser: 1.24 mg/dL (ref 0.40–1.50)
GFR: 79.47 mL/min (ref >60.00)
Glucose, Bld: 97 mg/dL (ref 70–99)
Potassium: 3.9 meq/L (ref 3.5–5.1)
Sodium: 142 meq/L (ref 135–145)

## 2023-08-27 LAB — LIPID PANEL
Cholesterol: 193 mg/dL (ref 0–200)
HDL: 54.5 mg/dL (ref >39.00)
LDL Cholesterol: 123 mg/dL — ABNORMAL HIGH (ref 0–99)
NonHDL: 138.36
Total CHOL/HDL Ratio: 4
Triglycerides: 77 mg/dL (ref 0.0–149.0)
VLDL: 15.4 mg/dL (ref 0.0–40.0)

## 2023-08-27 LAB — HEPATIC FUNCTION PANEL
ALT: 16 U/L (ref 0–53)
AST: 17 U/L (ref 0–37)
Albumin: 4.7 g/dL (ref 3.5–5.2)
Alkaline Phosphatase: 30 U/L — ABNORMAL LOW (ref 39–117)
Bilirubin, Direct: 0.1 mg/dL (ref 0.0–0.3)
Total Bilirubin: 0.8 mg/dL (ref 0.2–1.2)
Total Protein: 8.2 g/dL (ref 6.0–8.3)

## 2023-08-27 LAB — TSH: TSH: 2.62 u[IU]/mL (ref 0.35–5.50)

## 2023-08-27 LAB — HEMOGLOBIN A1C: Hgb A1c MFr Bld: 5.4 % (ref 4.6–6.5)

## 2023-08-27 NOTE — Addendum Note (Signed)
 Addended by: Carola Rhine on: 08/27/2023 09:27 AM   Modules accepted: Orders

## 2023-08-27 NOTE — Progress Notes (Signed)
 Subjective:    Patient ID: Lance Vasquez, male    DOB: January 17, 1996, 28 y.o.   MRN: [id number]  HPI Here for a well exam. He feels well. His GERD has been stable, but he is scheduled for an upper endoscopy with Dr. Adela Lank on 09-13-23. He is very excited about being accepted to the [employer] school, to begin this summer.    Review of Systems  Constitutional: Negative.   HENT: Negative.    Eyes: Negative.   Respiratory: Negative.    Cardiovascular: Negative.   Gastrointestinal: Negative.   Genitourinary: Negative.   Musculoskeletal: Negative.   Skin: Negative.   Neurological: Negative.   Psychiatric/Behavioral: Negative.         Objective:   Physical Exam Constitutional:      General: He is not in acute distress.    Appearance: Normal appearance. He is well-developed. He is not diaphoretic.  HENT:     Head: Normocephalic and atraumatic.     Right Ear: External ear normal.     Left Ear: External ear normal.     Nose: Nose normal.     Mouth/Throat:     Pharynx: No oropharyngeal exudate.  Eyes:     General: No scleral icterus.       Right eye: No discharge.        Left eye: No discharge.     Conjunctiva/sclera: Conjunctivae normal.     Pupils: Pupils are equal, round, and reactive to light.  Neck:     Thyroid: No thyromegaly.     Vascular: No JVD.     Trachea: No tracheal deviation.  Cardiovascular:     Rate and Rhythm: Normal rate and regular rhythm.     Pulses: Normal pulses.     Heart sounds: Normal heart sounds. No murmur heard.    No friction rub. No gallop.  Pulmonary:     Effort: Pulmonary effort is normal. No respiratory distress.     Breath sounds: Normal breath sounds. No wheezing or rales.  Chest:     Chest wall: No tenderness.  Abdominal:     General: Bowel sounds are normal. There is no distension.     Palpations: Abdomen is soft. There is no mass.     Tenderness: There is no abdominal tenderness. There is no guarding or rebound.   Genitourinary:    Penis: Normal. No tenderness.      Testes: Normal.  Musculoskeletal:        General: No tenderness. Normal range of motion.     Cervical back: Neck supple.  Lymphadenopathy:     Cervical: No cervical adenopathy.  Skin:    General: Skin is warm and dry.     Coloration: Skin is not pale.     Findings: No erythema or rash.  Neurological:     General: No focal deficit present.     Mental Status: He is alert and oriented to person, place, and time.     Cranial Nerves: No cranial nerve deficit.     Motor: No abnormal muscle tone.     Coordination: Coordination normal.     Deep Tendon Reflexes: Reflexes are normal and symmetric. Reflexes normal.  Psychiatric:        Mood and Affect: Mood normal.        Behavior: Behavior normal.        Thought Content: Thought content normal.        Judgment: Judgment normal.  Assessment & Plan:  Well exam. We discussed diet and exercise. Get fasting labs. Gershon Crane, MD

## 2023-08-29 ENCOUNTER — Encounter: Payer: Self-pay | Admitting: *Deleted

## 2023-09-11 ENCOUNTER — Encounter: Payer: Self-pay | Admitting: Certified Registered Nurse Anesthetist

## 2023-09-13 ENCOUNTER — Encounter: Payer: Self-pay | Admitting: Gastroenterology

## 2023-09-13 ENCOUNTER — Ambulatory Visit: Payer: BC Managed Care – PPO | Admitting: Gastroenterology

## 2023-09-13 VITALS — BP 120/82 | HR 92 | Temp 98.1°F | Resp 11 | Ht 70.0 in | Wt 202.0 lb

## 2023-09-13 DIAGNOSIS — K219 Gastro-esophageal reflux disease without esophagitis: Secondary | ICD-10-CM | POA: Diagnosis not present

## 2023-09-13 DIAGNOSIS — K2980 Duodenitis without bleeding: Secondary | ICD-10-CM

## 2023-09-13 DIAGNOSIS — R110 Nausea: Secondary | ICD-10-CM

## 2023-09-13 DIAGNOSIS — K449 Diaphragmatic hernia without obstruction or gangrene: Secondary | ICD-10-CM

## 2023-09-13 HISTORY — PX: ESOPHAGOGASTRODUODENOSCOPY: SHX1529

## 2023-09-13 MED ORDER — SODIUM CHLORIDE 0.9 % IV SOLN: 500.0000 mL | Freq: Once | INTRAVENOUS | Status: DC

## 2023-09-13 NOTE — Progress Notes (Signed)
 Called to room to assist during endoscopic procedure.  Patient ID and intended procedure confirmed with present staff. Received instructions for my participation in the procedure from the performing physician.

## 2023-09-13 NOTE — Progress Notes (Signed)
1017 Robinul 0.1 mg IV given due large amount of secretions upon assessment.  MD made aware, vss  

## 2023-09-13 NOTE — Patient Instructions (Addendum)
 Resume previous diet and medications.  Education on hernia attached.  Resume antacid and can take prescribed Protonix daily or as needed.    YOU HAD AN ENDOSCOPIC PROCEDURE TODAY AT THE [facility]:   Refer to the procedure report that was given to you for any specific questions about what was found during the examination.  If the procedure report does not answer your questions, please call your gastroenterologist to clarify.  If you requested that your care partner not be given the details of your procedure findings, then the procedure report has been included in a sealed envelope for you to review at your convenience later.  YOU SHOULD EXPECT: Some feelings of bloating in the abdomen. Passage of more gas than usual.  Walking can help get rid of the air that was put into your GI tract during the procedure and reduce the bloating. If you had a lower endoscopy (such as a colonoscopy or flexible sigmoidoscopy) you may notice spotting of blood in your stool or on the toilet paper. If you underwent a bowel prep for your procedure, you may not have a normal bowel movement for a few days.  Please Note:  You might notice some irritation and congestion in your nose or some drainage.  This is from the oxygen used during your procedure.  There is no need for concern and it should clear up in a day or so.  SYMPTOMS TO REPORT IMMEDIATELY:  Following upper endoscopy (EGD)  Vomiting of blood or coffee ground material  New chest pain or pain under the shoulder blades  Painful or persistently difficult swallowing  New shortness of breath  Fever of 100F or higher  Black, tarry-looking stools  For urgent or emergent issues, a gastroenterologist can be reached at any hour by calling [phone]. Do not use MyChart messaging for urgent concerns.    DIET:  We do recommend a small meal at first, but then you may proceed to your regular diet.  Drink plenty of fluids but you should avoid alcoholic  beverages for 24 hours.  ACTIVITY:  You should plan to take it easy for the rest of today and you should NOT DRIVE or use heavy machinery until tomorrow (because of the sedation medicines used during the test).    FOLLOW UP: Our staff will call the number listed on your records the next business day following your procedure.  We will call around 7:15- 8:00 am to check on you and address any questions or concerns that you may have regarding the information given to you following your procedure. If we do not reach you, we will leave a message.     If any biopsies were taken you will be contacted by phone or by letter within the next 1-3 weeks.  Please call us at [phone] if you have not heard about the biopsies in 3 weeks.    SIGNATURES/CONFIDENTIALITY: You and/or your care partner have signed paperwork which will be entered into your electronic medical record.  These signatures attest to the fact that that the information above on your After Visit Summary has been reviewed and is understood.  Full responsibility of the confidentiality of this discharge information lies with you and/or your care-partner.

## 2023-09-13 NOTE — Progress Notes (Signed)
 [facility] History and Physical   Primary Care Physician:  Nelwyn Salisbury, MD   Reason for Procedure:   GERD, nausea, history of H pylori  Plan:    EGD     HPI: Lance Vasquez is a 28 y.o. male  here for EGD to evaluate upper tract symptoms as outlined. Given protonix 40mg  / day has not started it yet, using TUMS PRN and used nexium in the past. Symptoms longstanding. Frequent nausea, pyorosis, reflux. First time EGD.   Otherwise feels well without any cardiopulmonary symptoms.   I have discussed risks / benefits of anesthesia and endoscopic procedure with Eliberto Ivory Bess and they wish to proceed with the exams as outlined today.    Past Medical History:  Diagnosis Date   Migraines     Past Surgical History:  Procedure Laterality Date   EYE EXAMINATION UNDER ANESTHESIA Bilateral 01/15/2013   Procedure: Repair of Eyelid Lacerations;  Surgeon: Corinda Gubler, MD;  Location: [facility] OR;  Service: Ophthalmology;  Laterality: Bilateral;   FEMUR IM NAIL Right 01/15/2013   Procedure: INTRAMEDULLARY (IM) NAIL FEMORAL;  Surgeon: Mable Paris, MD;  Location: [facility] OR;  Service: Orthopedics;  Laterality: Right;   FOREIGN BODY REMOVAL Bilateral 01/15/2013   Procedure: FOREIGN BODY REMOVAL;  Surgeon: Corinda Gubler, MD;  Location: [facility] OR;  Service: Ophthalmology;  Laterality: Bilateral;   INCISION AND DRAINAGE OF WOUND Bilateral 01/15/2013   Procedure: IRRIGATION AND DEBRIDEMENT WOUND;  Surgeon: Corinda Gubler, MD;  Location: [facility] OR;  Service: Ophthalmology;  Laterality: Bilateral;   LACERATION REPAIR N/A 01/15/2013   Procedure: LACERATION REPAIR;  Surgeon: Mable Paris, MD;  Location: [facility] OR;  Service: Orthopedics;  Laterality: N/A;   TIBIA IM NAIL INSERTION Right 01/15/2013   Procedure: INTRAMEDULLARY (IM) NAIL TIBIAL;  Surgeon: Mable Paris, MD;  Location: [facility] OR;  Service: Orthopedics;  Laterality: Right;    Prior to Admission medications    Medication Sig Start Date End Date Taking? Authorizing Provider  cetirizine (ZYRTEC) 10 MG tablet Take 10 mg by mouth daily as needed for allergies.   Yes [provider]  Aspirin-Acetaminophen-Caffeine (EXCEDRIN EXTRA STRENGTH PO) Take 1 capsule by mouth as needed.    [provider]  pantoprazole (PROTONIX) 40 MG tablet Take 1 tablet (40 mg total) by mouth daily. Patient not taking: Reported on 09/13/2023 08/06/23   Legrand Como, PA-C    Current Outpatient Medications  Medication Sig Dispense Refill   cetirizine (ZYRTEC) 10 MG tablet Take 10 mg by mouth daily as needed for allergies.     Aspirin-Acetaminophen-Caffeine (EXCEDRIN EXTRA STRENGTH PO) Take 1 capsule by mouth as needed.     pantoprazole (PROTONIX) 40 MG tablet Take 1 tablet (40 mg total) by mouth daily. (Patient not taking: Reported on 09/13/2023) 30 tablet 3   Current Facility-Administered Medications  Medication Dose Route Frequency Provider Last Rate Last Admin   0.9 %  sodium chloride infusion  500 mL Intravenous Once Mattheus Rauls, Willaim Rayas, MD        Allergies as of 09/13/2023   (No Known Allergies)    Family History  Problem Relation Age of Onset   Hypertension Maternal Grandmother     Social History   Socioeconomic History   Marital status: Single    Spouse name: Not on file   Number of children: 0   Years of education: Not on file   Highest education level: Not on file  Occupational History   Occupation: Charity fundraiser  Tobacco Use   Smoking status: Never   Smokeless tobacco: Never  Vaping Use   Vaping status: Never Used  Substance and Sexual Activity   Alcohol use: Yes    Comment: <1 per day   Drug use: No   Sexual activity: Not on file  Other Topics Concern   Not on file  Social History Narrative   Not on file   Social Drivers of Health   Financial Resource Strain: Not on file  Food Insecurity: Not on file  Transportation Needs: Not on file  Physical Activity: Not on file   Stress: Not on file  Social Connections: Not on file  Intimate Partner Violence: Not on file    Review of Systems: All other review of systems negative except as mentioned in the HPI.  Physical Exam: Vital signs BP (!) 139/92   Pulse 92   Temp 98.1 F (36.7 C)   Ht 5\' 10"  (1.778 m)   Wt 202 lb (91.6 kg)   SpO2 100%   BMI 28.98 kg/m   General:   Alert,  Well-developed, pleasant and cooperative in NAD Lungs:  Clear throughout to auscultation.   Heart:  Regular rate and rhythm Abdomen:  Soft, nontender and nondistended.   Neuro/Psych:  Alert and cooperative. Normal mood and affect. A and O x 3  Harlin Rain, MD [facility]

## 2023-09-13 NOTE — Progress Notes (Signed)
 Report given to PACU, vss

## 2023-09-13 NOTE — Op Note (Signed)
 [facility] Patient Name: Lance Vasquez Procedure Date: 09/13/2023 10:15 AM MRN: [id number] Endoscopist: Viviann Spare P. Adela Lank , MD, [id number] Age: 28 Referring MD:  Date of Birth: 1995/11/08 Gender: Male Account #: [id number] Procedure:                Upper GI endoscopy Indications:              history of gastro-esophageal reflux disease,                            Nausea, reported history of H pylori in the past Medicines:                Monitored Anesthesia Care Procedure:                Pre-Anesthesia Assessment:                           - Prior to the procedure, a History and Physical                            was performed, and patient medications and                            allergies were reviewed. The patient's tolerance of                            previous anesthesia was also reviewed. The risks                            and benefits of the procedure and the sedation                            options and risks were discussed with the patient.                            All questions were answered, and informed consent                            was obtained. Prior Anticoagulants: The patient has                            taken no anticoagulant or antiplatelet agents. ASA                            Grade Assessment: I - A normal, healthy patient.                            After reviewing the risks and benefits, the patient                            was deemed in satisfactory condition to undergo the                            procedure.  After obtaining informed consent, the endoscope was                            passed under direct vision. Throughout the                            procedure, the patient's blood pressure, pulse, and                            oxygen saturations were monitored continuously. The                            GIF [id number] #[id number] was introduced through the                            mouth, and  advanced to the second part of duodenum.                            The upper GI endoscopy was accomplished without                            difficulty. The patient tolerated the procedure                            well. Scope In: Scope Out: Findings:                 Esophagogastric landmarks were identified: the                            Z-line was found at 40 cm, the gastroesophageal                            junction was found at 40 cm and the upper extent of                            the gastric folds was found at 41 cm from the                            incisors.                           A 1 cm hiatal hernia was present.                           The gastroesophageal flap valve was visualized                            endoscopically and classified as Hill Grade II                            (fold present, opens with respiration).                           The exam of the esophagus was  otherwise normal. No                            Barrett's or erosive changes.                           The entire examined stomach was normal. Biopsies                            were taken with a cold forceps for Helicobacter                            pylori testing.                           The examined duodenum was normal. Biopsies for                            histology were taken with a cold forceps for                            evaluation of celiac disease. Complications:            No immediate complications. Estimated blood loss:                            Minimal. Estimated Blood Loss:     Estimated blood loss was minimal. Impression:               - Esophagogastric landmarks identified.                           - 1 cm hiatal hernia.                           - Gastroesophageal flap valve classified as Hill                            Grade II (fold present, opens with respiration).                           - Normal esophagus otherwise - no Barrett's                            - Normal stomach. Biopsied.                           - Normal examined duodenum. Biopsied. Recommendation:           - Patient has a contact number available for                            emergencies. The signs and symptoms of potential                            delayed complications were discussed with the  patient. Return to normal activities tomorrow.                            Written discharge instructions were provided to the                            patient.                           - Resume previous diet.                           - Continue present medications.                           - Await pathology results.                           - Can resume antacid as needed. Prescribed protonix                            40mg  can use daily or as needed, will discuss                            options with him. If he finds reliable relief with                            it and it prevents symptoms, long term use the                            lowest daily dose needed to control symptoms. Viviann Spare P. Glinda Natzke, MD 09/13/2023 10:33:45 AM This report has been signed electronically.

## 2023-09-14 ENCOUNTER — Telehealth: Payer: Self-pay | Admitting: *Deleted

## 2023-09-14 NOTE — Telephone Encounter (Signed)
  Follow up Call-     09/13/2023    9:23 AM  Call back number  Post procedure Call Back phone  # [phone]  Permission to leave phone message Yes     Patient questions:  Do you have a fever, pain , or abdominal swelling? No. Pain Score  0 *  Have you tolerated food without any problems? Yes.    Have you been able to return to your normal activities? Yes.    Do you have any questions about your discharge instructions: Diet   No. Medications  No. Follow up visit  No.  Do you have questions or concerns about your Care? No.  Actions: * If pain score is 4 or above: No action needed, pain <4.

## 2023-09-16 ENCOUNTER — Telehealth: Payer: Self-pay | Admitting: Pediatrics

## 2023-09-16 DIAGNOSIS — R051 Acute cough: Secondary | ICD-10-CM | POA: Diagnosis not present

## 2023-09-16 DIAGNOSIS — R5383 Other fatigue: Secondary | ICD-10-CM | POA: Diagnosis not present

## 2023-09-16 DIAGNOSIS — U071 COVID-19: Secondary | ICD-10-CM | POA: Diagnosis not present

## 2023-09-16 DIAGNOSIS — R509 Fever, unspecified: Secondary | ICD-10-CM | POA: Diagnosis not present

## 2023-09-16 NOTE — Telephone Encounter (Signed)
 Paged on-call by Lance Vasquez re: development of fever 99-101 F over the last 12 hours  He had an EGD performed on Thursday, 09/13/2022 for an indication of GERD and nausea.  Biopsies were performed.  No other interventions.  No immediate complications of the procedure.  Lance Vasquez was feeling well on Friday without any obvious symptoms.  States that he awoke Saturday morning and had some malaise.  Noted low-grade temperature last night of 99 degrees with body achiness, slight cough, throat drainage, mild headache.  Endorsed mild abdominal tenderness but no severe pain that would be suggestive of perforation.  Has had mild dizziness.  This morning his temperature to 101F.   Based upon our conversation and symptoms described, discussed that there are no worrisome findings for aspiration pneumonia or perforation.  Given constellation of low-grade fever, nausea, body aches and mild URI symptoms reviewed that he may be developing a viral respiratory syndrome.  Advised that he could consider being tested for COVID and influenza A at an urgent care as if positive could benefit from Paxlovid or Tamiflu.  Recommended continuing to monitor symptoms and temperature curve -can take Tylenol or ibuprofen as needed.  Counseled that if fevers persisted or new symptoms developed would recommend contacting our office back or seeking additional care.  All questions answered.

## 2023-09-17 ENCOUNTER — Encounter: Payer: Self-pay | Admitting: Gastroenterology

## 2023-09-17 LAB — SURGICAL PATHOLOGY

## 2023-09-17 NOTE — Telephone Encounter (Signed)
 Thank you for taking this call Harriett Sine, agree with your recommendations.

## 2023-10-22 ENCOUNTER — Encounter: Payer: Self-pay | Admitting: Family Medicine

## 2023-10-23 ENCOUNTER — Ambulatory Visit: Payer: Self-pay

## 2023-10-23 NOTE — Telephone Encounter (Signed)
 Copied From CRM [id number]. Reason for Triage: bat exposure while  walking dog,something wet flew in eye,no issues,wonder what precautions he should take?   Chief Complaint: substance in eye Symptoms: irritated eye Frequency: 2 days ago Pertinent Negatives: Patient denies fever, drainage, redness, swelling, vision changes Disposition: [] ED /[] Urgent Care (no appt availability in office) / [x] Appointment(In office/virtual)/ []  [facility]/ [] Home Care/ [x] Refused Recommended Disposition /[] [facility]/ []  Follow-up with PCP Additional Notes: Pt states that 2 days ago he saw bats and had something wet go into his eye. Pt states that he immediately came into house and washed eyes. Pt states this occurred for about a few minutes. Pt states no vision changes. Pt states that he feels like his R eye has been more irritated. Denies swelling, redness, drainage from eye. Pt states that he has recently moved and the pcp office is about 1.5 hours from new residence. Pt states that he also is not sure of insurance coverage. Pt refused appt at this time, states he may use UC near him for this.   Reason for Disposition . Minor foreign body in the eye  Answer Assessment - Initial Assessment Questions 1. TYPE OF FOREIGN BODY: "What got in the eye?"      Unsure, something wet after bat flew near him 2. ONSET: "When did it happen?"      2 days ago 3. MECHANISM: "How did it happen?"      Something dropped into eye from sky, was not raining, hadbat fly over him at time of wetness 4. VISION: "Do you have blurred vision?"      denies 5. PAIN: "Is it painful?" If Yes, ask: "How bad is the pain?"  (Scale 1-10; or mild, moderate, severe)     No pain, just irritation 6. CONTACTS: "Do you wear contacts?"     denies 7. OTHER SYMPTOMS: "Do you have any other symptoms?"     Denies drainage, redness, swelling  Protocols used: Eye - Foreign Body-A-AH

## 2023-10-24 ENCOUNTER — Ambulatory Visit (INDEPENDENT_AMBULATORY_CARE_PROVIDER_SITE_OTHER): Payer: Self-pay | Admitting: Family Medicine

## 2023-10-24 ENCOUNTER — Encounter: Payer: Self-pay | Admitting: Family Medicine

## 2023-10-24 VITALS — BP 112/80 | HR 82 | Temp 98.6°F | Wt 201.0 lb

## 2023-10-24 DIAGNOSIS — J3089 Other allergic rhinitis: Secondary | ICD-10-CM | POA: Insufficient documentation

## 2023-10-24 NOTE — Progress Notes (Signed)
   Subjective:    Patient ID: Lavella Poser, male    DOB: 1995-09-20, 28 y.o.   MRN: [id number]  HPI Here for one week of burning, itching, and redness in both eyes. He has had springtime allergies for years, and they have been acting up lately. He also has sneezing and itchy skin. No vision changes. He does not wear contact lenses.    Review of Systems  Constitutional: Negative.   HENT: Negative.    Eyes:  Positive for redness and itching. Negative for photophobia, pain, discharge and visual disturbance.  Respiratory: Negative.         Objective:   Physical Exam Constitutional:      Appearance: Normal appearance.  HENT:     Right Ear: Tympanic membrane, ear canal and external ear normal.     Left Ear: Tympanic membrane, ear canal and external ear normal.     Nose: Nose normal.     Mouth/Throat:     Pharynx: Oropharynx is clear.  Eyes:     Conjunctiva/sclera: Conjunctivae normal.     Pupils: Pupils are equal, round, and reactive to light.  Pulmonary:     Effort: Pulmonary effort is normal.     Breath sounds: Normal breath sounds.  Lymphadenopathy:     Cervical: No cervical adenopathy.  Neurological:     Mental Status: He is alert.           Assessment & Plan:  He has allergic conjunctivitis. He will try Naphcon A eye drops as needed (OTC).  Corita Diego, MD

## 2023-10-24 NOTE — Telephone Encounter (Signed)
 Noted.

## 2023-10-26 NOTE — Telephone Encounter (Signed)
 Pt has a visit with Dr Alyne Babinski on 10/24/23 for this

## 2023-12-10 ENCOUNTER — Encounter: Payer: Self-pay | Admitting: Family Medicine

## 2023-12-25 ENCOUNTER — Ambulatory Visit: Payer: Self-pay | Admitting: Family Medicine

## 2024-01-02 ENCOUNTER — Encounter: Payer: Self-pay | Admitting: Family Medicine

## 2024-01-02 ENCOUNTER — Ambulatory Visit: Payer: Self-pay | Admitting: Family Medicine

## 2024-01-02 VITALS — BP 110/78 | HR 82 | Temp 98.6°F | Wt 203.0 lb

## 2024-01-02 DIAGNOSIS — K589 Irritable bowel syndrome without diarrhea: Secondary | ICD-10-CM | POA: Insufficient documentation

## 2024-01-02 DIAGNOSIS — K219 Gastro-esophageal reflux disease without esophagitis: Secondary | ICD-10-CM | POA: Diagnosis not present

## 2024-01-02 NOTE — Progress Notes (Signed)
   Subjective:    Patient ID: Lance Vasquez, male    DOB: 11-03-1995, 28 y.o.   MRN: [id number]  HPI Here to discuss GI issues. For the past 2 weeks he has had frequent loose stools and mild nausea of and on. The stools are mushy and not liquid. No fever or abdominal pain. He has frequent heartburn, and he was given Pantoprazole  by Dr. Leigh a few months ago. He only takes this sporadically however, and he uses TUMS frequently. He had an EGD on 09-13-23, and other than a 1 cm hiatal hernia this was normal. He has reduced his intake of caffeine and alcohol.    Review of Systems  Constitutional: Negative.   Respiratory: Negative.    Cardiovascular: Negative.   Gastrointestinal:  Positive for diarrhea and nausea. Negative for abdominal distention, abdominal pain, blood in stool, constipation, rectal pain and vomiting.       Objective:   Physical Exam Constitutional:      Appearance: Normal appearance.  Cardiovascular:     Rate and Rhythm: Normal rate and regular rhythm.     Pulses: Normal pulses.     Heart sounds: Normal heart sounds.  Pulmonary:     Effort: Pulmonary effort is normal.     Breath sounds: Normal breath sounds.  Abdominal:     General: Abdomen is flat. Bowel sounds are normal. There is no distension.     Palpations: Abdomen is soft. There is no mass.     Tenderness: There is no abdominal tenderness. There is no guarding or rebound.     Hernia: No hernia is present.  Neurological:     Mental Status: He is alert.           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 I advised him to take the Pantoprazole  every day first thing in the mornings. He will follow up as needed.  Garnette Olmsted, MD

## 2024-02-18 DIAGNOSIS — Z2989 Encounter for other specified prophylactic measures: Secondary | ICD-10-CM | POA: Diagnosis not present

## 2024-02-18 DIAGNOSIS — Z23 Encounter for immunization: Secondary | ICD-10-CM | POA: Diagnosis not present

## 2024-02-25 DIAGNOSIS — Z23 Encounter for immunization: Secondary | ICD-10-CM | POA: Diagnosis not present

## 2024-02-25 DIAGNOSIS — Z2989 Encounter for other specified prophylactic measures: Secondary | ICD-10-CM | POA: Diagnosis not present

## 2024-03-02 DIAGNOSIS — U071 COVID-19: Secondary | ICD-10-CM | POA: Diagnosis not present

## 2024-03-02 DIAGNOSIS — Z20822 Contact with and (suspected) exposure to covid-19: Secondary | ICD-10-CM | POA: Diagnosis not present

## 2024-04-05 DIAGNOSIS — M25511 Pain in right shoulder: Secondary | ICD-10-CM | POA: Diagnosis not present

## 2024-04-29 ENCOUNTER — Other Ambulatory Visit: Payer: Self-pay | Admitting: Gastroenterology

## 2024-06-30 ENCOUNTER — Encounter: Payer: Self-pay | Admitting: Family Medicine

## 2024-07-02 MED ORDER — PANTOPRAZOLE SODIUM 40 MG PO TBEC: 40.0000 mg | DELAYED_RELEASE_TABLET | Freq: Every day | ORAL | 3 refills | Status: DC

## 2024-07-02 NOTE — Telephone Encounter (Signed)
 Done

## 2024-07-11 ENCOUNTER — Other Ambulatory Visit: Payer: Self-pay | Admitting: Gastroenterology
